# Patient Record
Sex: Male | Born: 1970 | Race: Black or African American | Hispanic: No | Marital: Single | Smoking: Never smoker
Health system: Southern US, Community
[De-identification: ages and names within clinical notes are randomized; demographics above are authoritative.]

## PROBLEM LIST (undated history)

## (undated) DIAGNOSIS — J3089 Other allergic rhinitis: Secondary | ICD-10-CM

## (undated) DIAGNOSIS — J45909 Unspecified asthma, uncomplicated: Secondary | ICD-10-CM

---

## 2004-05-08 ENCOUNTER — Emergency Department: Payer: Self-pay | Admitting: General Practice

## 2017-12-25 ENCOUNTER — Other Ambulatory Visit: Payer: Self-pay

## 2017-12-25 ENCOUNTER — Encounter (HOSPITAL_BASED_OUTPATIENT_CLINIC_OR_DEPARTMENT_OTHER): Payer: Self-pay

## 2017-12-25 ENCOUNTER — Emergency Department (HOSPITAL_BASED_OUTPATIENT_CLINIC_OR_DEPARTMENT_OTHER)
Admission: EM | Admit: 2017-12-25 | Discharge: 2017-12-25 | Disposition: A | Discharge status: Home / Self Care | Payer: BLUE CROSS/BLUE SHIELD | Attending: Emergency Medicine | Admitting: Emergency Medicine

## 2017-12-25 DIAGNOSIS — J069 Acute upper respiratory infection, unspecified: Secondary | ICD-10-CM | POA: Diagnosis not present

## 2017-12-25 DIAGNOSIS — J029 Acute pharyngitis, unspecified: Secondary | ICD-10-CM | POA: Diagnosis present

## 2017-12-25 DIAGNOSIS — J45909 Unspecified asthma, uncomplicated: Secondary | ICD-10-CM | POA: Diagnosis not present

## 2017-12-25 DIAGNOSIS — Z79899 Other long term (current) drug therapy: Secondary | ICD-10-CM | POA: Diagnosis not present

## 2017-12-25 HISTORY — DX: Unspecified asthma, uncomplicated: J45.909

## 2017-12-25 HISTORY — DX: Other allergic rhinitis: J30.89

## 2017-12-25 MED ORDER — GUAIFENESIN-DM 100-10 MG/5ML PO SYRP: 5.0000 mL | ORAL_SOLUTION | Freq: Three times a day (TID) | ORAL | 0 refills | Status: AC | PRN

## 2017-12-25 NOTE — ED Provider Notes (Signed)
[facility] EMERGENCY DEPARTMENT Provider Note   CSN: [id number] Arrival date & time: 12/25/17  1511     History   Chief Complaint Chief Complaint  Patient presents with  . Cough    HPI Anthony Vazquez is a 47 y.o. male.  HPI Patient presents with cough.  Has had for the last 3 days.  States that it started with sore throat and nasal congestion.  Now the cough is worse in the morning.  Has had Zyrtec without relief.  No chest pain.  No fevers.  History of occasional asthma but states he does not feel as if he is wheezing now.  States he was around a baby that has pneumonia. He does not smoke. Past Medical History:  Diagnosis Date  . Asthma   . Environmental and seasonal allergies     There are no active problems to display for this patient.   History reviewed. No pertinent surgical history.      Home Medications    Prior to Admission medications   Medication Sig Start Date End Date Taking? Authorizing Provider  guaiFENesin-dextromethorphan (ROBITUSSIN DM) 100-10 MG/5ML syrup Take 5 mLs by mouth 3 (three) times daily as needed for cough. 12/25/17   Benjiman Core, MD    Family History No family history on file.  Social History Social History   Tobacco Use  . Smoking status: Never Smoker  . Smokeless tobacco: Never Used  Substance Use Topics  . Alcohol use: Never    Frequency: Never  . Drug use: Never     Allergies   Patient has no known allergies.   Review of Systems Review of Systems  Constitutional: Negative for appetite change and fatigue.  HENT: Positive for congestion and sore throat.   Respiratory: Positive for cough. Negative for shortness of breath.   Cardiovascular: Negative for chest pain.  Gastrointestinal: Positive for nausea.  Genitourinary: Negative for flank pain.  Musculoskeletal: Negative for back pain.  Skin: Negative for rash.  Neurological: Negative for speech difficulty.  Hematological: Negative for adenopathy.    Psychiatric/Behavioral: Negative for confusion.     Physical Exam Updated Vital Signs BP 138/83 (BP Location: Left Arm)   Pulse 94   Temp 98.4 F (36.9 C) (Oral)   Resp 18   Ht 5\' 7"  (1.702 m)   Wt 109.3 kg   SpO2 98%   BMI 37.75 kg/m   Physical Exam  Constitutional: He appears well-developed.  HENT:  Head: Atraumatic.  Mild posterior pharyngeal erythema without exudate.  Eyes: Pupils are equal, round, and reactive to light.  Neck: Neck supple.  Cardiovascular: Normal rate.  Pulmonary/Chest: Effort normal. No stridor. He has no wheezes. He has no rales.  Abdominal: There is no tenderness.  Musculoskeletal: He exhibits no edema.  Neurological: He is alert.  Skin: Skin is warm.     ED Treatments / Results  Labs (all labs ordered are listed, but only abnormal results are displayed) Labs Reviewed - No data to display  EKG None  Radiology No results found.  Procedures Procedures (including critical care time)  Medications Ordered in ED Medications - No data to display   Initial Impression / Assessment and Plan / ED Course  I have reviewed the triage vital signs and the nursing notes.  Pertinent labs & imaging results that were available during my care of the patient were reviewed by me and considered in my medical decision making (see chart for details).     Patient with URI symptoms.  Lungs are clear.  Doubt pneumonia.  Doubt strep.  Given prescription for Robitussin-DM to take as needed.  Patient also has some issues and will need to be followed with a primary care doctor, such as requesting Viagra.  Final Clinical Impressions(s) / ED Diagnoses   Final diagnoses:  Upper respiratory tract infection, unspecified type    ED Discharge Orders         Ordered    guaiFENesin-dextromethorphan (ROBITUSSIN DM) 100-10 MG/5ML syrup  3 times daily PRN     12/25/17 1544           Benjiman Core, MD 12/25/17 1601

## 2017-12-25 NOTE — ED Notes (Signed)
Pt verbalizes understanding of d/c instructions and denies any further needs at this time. 

## 2017-12-25 NOTE — Discharge Instructions (Signed)
Follow-up with the primary care doctor for further medical issues.

## 2017-12-25 NOTE — ED Triage Notes (Signed)
C/o flu like sx x 3 days-NAD-steady gait 

## 2017-12-31 ENCOUNTER — Ambulatory Visit: Payer: BLUE CROSS/BLUE SHIELD | Admitting: Family Medicine

## 2018-01-12 ENCOUNTER — Ambulatory Visit: Payer: BLUE CROSS/BLUE SHIELD | Admitting: Family Medicine

## 2018-01-12 DIAGNOSIS — Z0289 Encounter for other administrative examinations: Secondary | ICD-10-CM

## 2018-05-02 ENCOUNTER — Emergency Department (HOSPITAL_BASED_OUTPATIENT_CLINIC_OR_DEPARTMENT_OTHER)
Admission: EM | Admit: 2018-05-02 | Discharge: 2018-05-02 | Disposition: A | Discharge status: Home / Self Care | Payer: BLUE CROSS/BLUE SHIELD | Attending: Emergency Medicine | Admitting: Emergency Medicine

## 2018-05-02 ENCOUNTER — Other Ambulatory Visit: Payer: Self-pay

## 2018-05-02 ENCOUNTER — Encounter (HOSPITAL_BASED_OUTPATIENT_CLINIC_OR_DEPARTMENT_OTHER): Payer: Self-pay | Admitting: *Deleted

## 2018-05-02 DIAGNOSIS — B353 Tinea pedis: Secondary | ICD-10-CM | POA: Insufficient documentation

## 2018-05-02 DIAGNOSIS — R519 Headache, unspecified: Secondary | ICD-10-CM

## 2018-05-02 DIAGNOSIS — J45909 Unspecified asthma, uncomplicated: Secondary | ICD-10-CM | POA: Insufficient documentation

## 2018-05-02 DIAGNOSIS — J309 Allergic rhinitis, unspecified: Secondary | ICD-10-CM | POA: Insufficient documentation

## 2018-05-02 DIAGNOSIS — R51 Headache: Secondary | ICD-10-CM | POA: Insufficient documentation

## 2018-05-02 DIAGNOSIS — Z79899 Other long term (current) drug therapy: Secondary | ICD-10-CM | POA: Insufficient documentation

## 2018-05-02 MED ORDER — CETIRIZINE HCL 10 MG PO TABS: 10.0000 mg | ORAL_TABLET | Freq: Every day | ORAL | 0 refills | Status: AC

## 2018-05-02 MED ORDER — CLOTRIMAZOLE 1 % EX CREA: TOPICAL_CREAM | CUTANEOUS | 0 refills | Status: AC

## 2018-05-02 MED ORDER — FLUTICASONE PROPIONATE 50 MCG/ACT NA SUSP: 1.0000 | Freq: Every day | NASAL | 0 refills | Status: AC

## 2018-05-02 MED ORDER — TETRACAINE HCL 0.5 % OP SOLN
1.0000 [drp] | Freq: Once | OPHTHALMIC | Status: DC
Start: 2018-05-02 — End: 2018-05-02
  Filled 2018-05-02: qty 4

## 2018-05-02 NOTE — ED Triage Notes (Signed)
Pt c/o pain behind right eye since yesterday

## 2018-05-02 NOTE — ED Provider Notes (Signed)
[facility] EMERGENCY DEPARTMENT Provider Note   CSN: [id number] Arrival date & time: 05/02/18  2106    History   Chief Complaint Chief Complaint  Patient presents with  . Eye Pain    HPI Anthony Vazquez is a 48 y.o. male.     48yo M w/ PMH including asthma and allergies who p/w headache and sinus pain.  Patient states that over the last day he has had nasal congestion, right-sided rhinorrhea, and sinus pain that goes up the right side of his face to his scalp.  He does report history of seasonal allergies and this feels similar.  No associated cough, vision changes, fevers, vomiting, extremity weakness/numbness, balance problems.  She also notes problem with athlete's foot on both feet.  He has previously used clotrimazole but does not currently have the medication.  The history is provided by the patient.  Eye Pain     Past Medical History:  Diagnosis Date  . Asthma   . Environmental and seasonal allergies     There are no active problems to display for this patient.   History reviewed. No pertinent surgical history.      Home Medications    Prior to Admission medications   Medication Sig Start Date End Date Taking? Authorizing Provider  etodolac (LODINE) 400 MG tablet Take by mouth. 04/23/18  Yes [provider]  cetirizine (ZYRTEC ALLERGY) 10 MG tablet Take 1 tablet (10 mg total) by mouth daily. 05/02/18   Irelynn Schermerhorn, Ambrose Finland, MD  clotrimazole (LOTRIMIN) 1 % cream Apply to affected area 2 times daily until symptoms resolve 05/02/18   Patrici Minnis, Ambrose Finland, MD  fluticasone [facility]) 50 MCG/ACT nasal spray Place 1 spray into both nostrils daily. 05/02/18   Rosalene Wardrop, Ambrose Finland, MD  gabapentin (NEURONTIN) 300 MG capsule  04/01/18   [provider]  guaiFENesin-dextromethorphan (ROBITUSSIN DM) 100-10 MG/5ML syrup Take 5 mLs by mouth 3 (three) times daily as needed for cough. 12/25/17   Benjiman Core, MD  loperamide (IMODIUM) 2 MG capsule TK 1 C  PO QID PRN FOR DIARRHEA 04/20/18   [provider]  meloxicam (MOBIC) 15 MG tablet  04/01/18   [provider]    Family History No family history on file.  Social History Social History   Tobacco Use  . Smoking status: Never Smoker  . Smokeless tobacco: Never Used  Substance Use Topics  . Alcohol use: Never    Frequency: Never  . Drug use: Never     Allergies   Patient has no known allergies.   Review of Systems Review of Systems  Eyes: Positive for pain.   All other systems reviewed and are negative except that which was mentioned in HPI   Physical Exam Updated Vital Signs BP 132/81 (BP Location: Left Arm)   Pulse 93   Temp 98 F (36.7 C) (Oral)   Resp 18   Ht 5\' 7"  (1.702 m)   Wt 97.5 kg   SpO2 96%   BMI 33.67 kg/m   Physical Exam Vitals signs and nursing note reviewed.  Constitutional:      General: He is not in acute distress.    Appearance: He is well-developed.     Comments: Awake, alert  HENT:     Head: Normocephalic and atraumatic.     Nose: Congestion and rhinorrhea present.     Mouth/Throat:     Mouth: Mucous membranes are moist.     Pharynx: Oropharynx is clear.  Eyes:  Extraocular Movements: Extraocular movements intact.     Conjunctiva/sclera: Conjunctivae normal.     Pupils: Pupils are equal, round, and reactive to light.  Neck:     Musculoskeletal: Neck supple.  Cardiovascular:     Rate and Rhythm: Normal rate and regular rhythm.     Heart sounds: Normal heart sounds. No murmur.  Pulmonary:     Effort: Pulmonary effort is normal. No respiratory distress.     Breath sounds: Normal breath sounds.  Abdominal:     General: Bowel sounds are normal. There is no distension.     Palpations: Abdomen is soft.     Tenderness: There is no abdominal tenderness.  Skin:    General: Skin is warm and dry.     Comments: Erythema and mild skin breakdown with malodor between toes  Neurological:     Mental Status: He is alert  and oriented to person, place, and time.     Cranial Nerves: No cranial nerve deficit.     Motor: No abnormal muscle tone.     Deep Tendon Reflexes: Reflexes are normal and symmetric.     Comments: Fluent speech 5/5 strength and normal sensation x all 4 extremities  Psychiatric:        Thought Content: Thought content normal.        Judgment: Judgment normal.      ED Treatments / Results  Labs (all labs ordered are listed, but only abnormal results are displayed) Labs Reviewed - No data to display  EKG None  Radiology No results found.  Procedures Procedures (including critical care time)  Medications Ordered in ED Medications - No data to display   Initial Impression / Assessment and Plan / ED Course  I have reviewed the triage vital signs and the nursing notes.         Symptoms consistent with allergic rhinitis with sinus pressure/pain.  No concerning neurologic features.  No actual eye symptoms to suggest ophthalmologic emergency.  Recommended starting Zyrtec and Flonase.  He also has athlete's foot, provided with Lotrimin.  Final Clinical Impressions(s) / ED Diagnoses   Final diagnoses:  Allergic rhinitis, unspecified seasonality, unspecified trigger  Sinus headache  Tinea pedis of both feet    ED Discharge Orders         Ordered    cetirizine (ZYRTEC ALLERGY) 10 MG tablet  Daily     05/02/18 2142    fluticasone (FLONASE) 50 MCG/ACT nasal spray  Daily     05/02/18 2142    clotrimazole (LOTRIMIN) 1 % cream     05/02/18 2142           Amauria Younts, Ambrose Finland, MD 05/02/18 [phone]

## 2018-06-24 ENCOUNTER — Emergency Department (HOSPITAL_BASED_OUTPATIENT_CLINIC_OR_DEPARTMENT_OTHER): Payer: BLUE CROSS/BLUE SHIELD

## 2018-06-24 ENCOUNTER — Encounter (HOSPITAL_BASED_OUTPATIENT_CLINIC_OR_DEPARTMENT_OTHER): Payer: Self-pay

## 2018-06-24 ENCOUNTER — Emergency Department (HOSPITAL_BASED_OUTPATIENT_CLINIC_OR_DEPARTMENT_OTHER)
Admission: EM | Admit: 2018-06-24 | Discharge: 2018-06-24 | Disposition: A | Discharge status: Home / Self Care | Payer: BLUE CROSS/BLUE SHIELD | Attending: Emergency Medicine | Admitting: Emergency Medicine

## 2018-06-24 ENCOUNTER — Other Ambulatory Visit: Payer: Self-pay

## 2018-06-24 DIAGNOSIS — W228XXA Striking against or struck by other objects, initial encounter: Secondary | ICD-10-CM | POA: Diagnosis not present

## 2018-06-24 DIAGNOSIS — Y990 Civilian activity done for income or pay: Secondary | ICD-10-CM | POA: Insufficient documentation

## 2018-06-24 DIAGNOSIS — Z79899 Other long term (current) drug therapy: Secondary | ICD-10-CM | POA: Diagnosis not present

## 2018-06-24 DIAGNOSIS — M25512 Pain in left shoulder: Secondary | ICD-10-CM

## 2018-06-24 DIAGNOSIS — Y9389 Activity, other specified: Secondary | ICD-10-CM | POA: Diagnosis not present

## 2018-06-24 DIAGNOSIS — S40012A Contusion of left shoulder, initial encounter: Secondary | ICD-10-CM | POA: Diagnosis not present

## 2018-06-24 DIAGNOSIS — Y9289 Other specified places as the place of occurrence of the external cause: Secondary | ICD-10-CM | POA: Diagnosis not present

## 2018-06-24 DIAGNOSIS — J45909 Unspecified asthma, uncomplicated: Secondary | ICD-10-CM | POA: Diagnosis not present

## 2018-06-24 DIAGNOSIS — S4992XA Unspecified injury of left shoulder and upper arm, initial encounter: Secondary | ICD-10-CM | POA: Diagnosis present

## 2018-06-24 NOTE — ED Provider Notes (Signed)
[facility] EMERGENCY DEPARTMENT Provider Note   CSN: [id number] Arrival date & time: 06/24/18  1743    History   Chief Complaint Chief Complaint  Patient presents with  . Shoulder Pain    HPI Anthony Vazquez is a 48 y.o. male.     Patient is a 48 year old gentleman with past medical history of asthma who presents the emergency department for left shoulder pain.  Patient reports that 2 days ago while he was at work and lifting a heavy door with someone he door came back and hit him right in the anterior left shoulder.  Reports pain since then.  Denies any numbness, tingling.  Reports the pain sometimes radiates into his neck.  Describes it as soreness.  He is still able to do lifting and range of motion with his shoulder but would like to have it checked out to make sure it is nothing worse.     Past Medical History:  Diagnosis Date  . Asthma   . Environmental and seasonal allergies     There are no active problems to display for this patient.   History reviewed. No pertinent surgical history.      Home Medications    Prior to Admission medications   Medication Sig Start Date End Date Taking? Authorizing Provider  cetirizine (ZYRTEC ALLERGY) 10 MG tablet Take 1 tablet (10 mg total) by mouth daily. 05/02/18   Little, Ambrose Finlandachel Morgan, MD  clotrimazole (LOTRIMIN) 1 % cream Apply to affected area 2 times daily until symptoms resolve 05/02/18   Little, Ambrose Finlandachel Morgan, MD  etodolac (LODINE) 400 MG tablet Take by mouth. 04/23/18   [provider]  fluticasone (FLONASE) 50 MCG/ACT nasal spray Place 1 spray into both nostrils daily. 05/02/18   Little, Ambrose Finlandachel Morgan, MD  gabapentin (NEURONTIN) 300 MG capsule  04/01/18   [provider]  guaiFENesin-dextromethorphan (ROBITUSSIN DM) 100-10 MG/5ML syrup Take 5 mLs by mouth 3 (three) times daily as needed for cough. 12/25/17   Benjiman CorePickering, Nathan, MD  loperamide (IMODIUM) 2 MG capsule TK 1 C PO QID PRN FOR DIARRHEA 04/20/18    [provider]  meloxicam (MOBIC) 15 MG tablet  04/01/18   [provider]    Family History No family history on file.  Social History Social History   Tobacco Use  . Smoking status: Never Smoker  . Smokeless tobacco: Never Used  Substance Use Topics  . Alcohol use: Never    Frequency: Never  . Drug use: Never     Allergies   Patient has no known allergies.   Review of Systems Review of Systems  Constitutional: Negative for fever.  Musculoskeletal: Positive for arthralgias and neck pain. Negative for back pain, joint swelling and neck stiffness.  Skin: Negative for rash and wound.  Hematological: Does not bruise/bleed easily.     Physical Exam Updated Vital Signs BP 122/82 (BP Location: Left Arm)   Pulse 83   Temp 98 F (36.7 C) (Oral)   Resp 18   Ht 5\' 7"  (1.702 m)   Wt 97.5 kg   SpO2 97%   BMI 33.67 kg/m   Physical Exam Vitals signs and nursing note reviewed.  Constitutional:      Appearance: Normal appearance.  HENT:     Head: Normocephalic.  Eyes:     Conjunctiva/sclera: Conjunctivae normal.  Pulmonary:     Effort: Pulmonary effort is normal.  Musculoskeletal:     Left shoulder: He exhibits tenderness. He exhibits normal range of  motion, no swelling, no effusion, no deformity and no laceration.     Comments: Normal strength, sensation and range of motion of the left shoulder but he does have point tenderness at the [facility] joint.  Skin:    General: Skin is dry.  Neurological:     Mental Status: He is alert.  Psychiatric:        Mood and Affect: Mood normal.      ED Treatments / Results  Labs (all labs ordered are listed, but only abnormal results are displayed) Labs Reviewed - No data to display  EKG None  Radiology Dg Shoulder Left  Result Date: 06/24/2018 CLINICAL DATA:  Pain and injury EXAM: LEFT SHOULDER - 2+ VIEW COMPARISON:  None. FINDINGS: There is no evidence of fracture or dislocation. There is no evidence of  arthropathy or other focal bone abnormality. Soft tissues are unremarkable. IMPRESSION: Negative. Electronically Signed   By: Jasmine Pang M.D.   On: 06/24/2018 18:57    Procedures Procedures (including critical care time)  Medications Ordered in ED Medications - No data to display   Initial Impression / Assessment and Plan / ED Course  I have reviewed the triage vital signs and the nursing notes.  Pertinent labs & imaging results that were available during my care of the patient were reviewed by me and considered in my medical decision making (see chart for details).        Based on review of vitals, medical screening exam, lab work and/or imaging, there does not appear to be an acute, emergent etiology for the patient's symptoms. Counseled pt on good return precautions and encouraged both PCP and ED follow-up as needed.  Prior to discharge, I also discussed incidental imaging findings with patient in detail and advised appropriate, recommended follow-up in detail.  Clinical Impression: 1. Contusion of left shoulder, initial encounter   2. Acute pain of left shoulder     Disposition: Discharge  Prior to providing a prescription for a controlled substance, I independently reviewed the patient's recent prescription history on the [place] Controlled Substance Reporting System. The patient had no recent or regular prescriptions and was deemed appropriate for a brief, less than 3 day prescription of narcotic for acute analgesia.  This note was prepared with assistance of Conservation officer, historic buildings. Occasional wrong-word or sound-a-like substitutions may have occurred due to the inherent limitations of voice recognition software.   Final Clinical Impressions(s) / ED Diagnoses   Final diagnoses:  Contusion of left shoulder, initial encounter  Acute pain of left shoulder    ED Discharge Orders    None       Jeral Pinch 06/24/18 1917    Melene Plan,  DO 06/24/18 2142

## 2018-06-24 NOTE — Discharge Instructions (Signed)
Your shoulder x-ray is normal.  I think you just strained your shoulder.  You should rest your left shoulder, apply ice often for at least 1 week.

## 2018-06-24 NOTE — ED Triage Notes (Signed)
Pt states he was lifting a door at work 2 days ago-felt a pop in left shoulder-NAD-steady gait

## 2018-06-30 ENCOUNTER — Emergency Department (HOSPITAL_BASED_OUTPATIENT_CLINIC_OR_DEPARTMENT_OTHER)
Admission: EM | Admit: 2018-06-30 | Discharge: 2018-06-30 | Disposition: A | Discharge status: Home / Self Care | Payer: BLUE CROSS/BLUE SHIELD | Attending: Emergency Medicine | Admitting: Emergency Medicine

## 2018-06-30 ENCOUNTER — Encounter (HOSPITAL_BASED_OUTPATIENT_CLINIC_OR_DEPARTMENT_OTHER): Payer: Self-pay | Admitting: Emergency Medicine

## 2018-06-30 ENCOUNTER — Other Ambulatory Visit: Payer: Self-pay

## 2018-06-30 DIAGNOSIS — H11432 Conjunctival hyperemia, left eye: Secondary | ICD-10-CM | POA: Diagnosis present

## 2018-06-30 DIAGNOSIS — H1032 Unspecified acute conjunctivitis, left eye: Secondary | ICD-10-CM | POA: Diagnosis not present

## 2018-06-30 DIAGNOSIS — J45909 Unspecified asthma, uncomplicated: Secondary | ICD-10-CM | POA: Diagnosis not present

## 2018-06-30 MED ORDER — ERYTHROMYCIN 5 MG/GM OP OINT: TOPICAL_OINTMENT | OPHTHALMIC | 0 refills | Status: AC

## 2018-06-30 MED FILL — ERYTHROMYCIN EYE OINTMENT: 5 | 7 days supply | Qty: 4 | Fill #0

## 2018-06-30 NOTE — ED Triage Notes (Signed)
Pt c/o LT eye irritation x 3d

## 2018-06-30 NOTE — ED Provider Notes (Signed)
   Emergency Department Provider Note   I have reviewed the triage vital signs and the nursing notes.   HISTORY  Chief Complaint Eye Problem   HPI Anthony Vazquez is a 48 y.o. male with PMH of seasonal allergies and asthma presents to the emergency department for evaluation of left eye redness with mild irritation.  Denies pain or vision change.  Symptoms began yesterday.  He has family members with similar symptoms.  Denies fevers, pain behind the eye, or injury to the eye.  He does not wear contact lenses.  No radiation of symptoms or other modifying factors. Patient did note some thick discharge on the eyelid this AM.   Past Medical History:  Diagnosis Date  . Asthma   . Environmental and seasonal allergies     There are no active problems to display for this patient.   History reviewed. No pertinent surgical history.  Allergies Patient has no known allergies.  No family history on file.  Social History Social History   Tobacco Use  . Smoking status: Never Smoker  . Smokeless tobacco: Never Used  Substance Use Topics  . Alcohol use: Never    Frequency: Never  . Drug use: Never    Review of Systems  Constitutional: No fever/chills Eyes: No visual changes. Positive left eye redness.  ENT: No sore throat.  10-point ROS otherwise negative.  ____________________________________________   PHYSICAL EXAM:  VITAL SIGNS: ED Triage Vitals  Enc Vitals Group     BP 06/30/18 0955 110/78     Pulse Rate 06/30/18 0955 80     Resp 06/30/18 0955 20     Temp 06/30/18 0955 98 F (36.7 C)     Temp Source 06/30/18 0955 Oral     SpO2 06/30/18 0955 96 %     Weight 06/30/18 0956 224 lb 3 oz (101.7 kg)     Height 06/30/18 0956 5' 7.5" (1.715 m)    Constitutional: Alert and oriented. Well appearing and in no acute distress. Eyes: Conjunctivae are injected on the left. No periorbital cellulitis or lid edema. PERRL. EOMI. Head: Atraumatic. Nose: No congestion/rhinnorhea.  Mouth/Throat: Mucous membranes are moist.  Neck: No stridor.   Cardiovascular: Normal rate, regular rhythm. Respiratory: Normal respiratory effort.  Gastrointestinal:  No distention.  Musculoskeletal: No gross deformities of extremities. Neurologic:  Normal speech and language.  Skin: No rash noted.  ____________________________________________   PROCEDURES  Procedure(s) performed:   Procedures  None  ____________________________________________   INITIAL IMPRESSION / ASSESSMENT AND PLAN / ED COURSE  Pertinent labs & imaging results that were available during my care of the patient were reviewed by me and considered in my medical decision making (see chart for details).   Patient presents to the emergency department with left eye irritation and redness.  Symptoms are consistent with conjunctivitis.  Plan for erythromycin ointment.  Patient does not wear contact lenses.  His exam is not consistent with periorbital or orbital cellulitis.  No vision changes or pain to suspect acute glaucoma.  Plan for antibiotic ointment and PCP follow-up.  Discussed ED return precautions.   ____________________________________________  FINAL CLINICAL IMPRESSION(S) / ED DIAGNOSES  Final diagnoses:  Acute conjunctivitis of left eye, unspecified acute conjunctivitis type    Note:  This document was prepared using Dragon voice recognition software and may include unintentional dictation errors.  Alona Bene, MD Emergency Medicine    , Arlyss Repress, MD 06/30/18 1026

## 2018-06-30 NOTE — Discharge Instructions (Signed)
You have an eye infection called conjunctivitis.  This can be caused by a viral or bacterial infection, but we treat with antibiotics either way to be safe.  Please use the provided antibiotics or fill the provided prescription and use as directed.  Follow up as indicated on your instructions.  Return to the Emergency Department if your symptoms worsen in spite of treatment or if you develop new symptoms that concern you. ° °

## 2019-07-16 IMAGING — DX LEFT SHOULDER - 2+ VIEW
3 series · 3 of 3 positions shown · non-contrast
Comparison: None.

CLINICAL DATA: Pain and injury

EXAM:
LEFT SHOULDER - 2+ VIEW

[shoulder grashey]
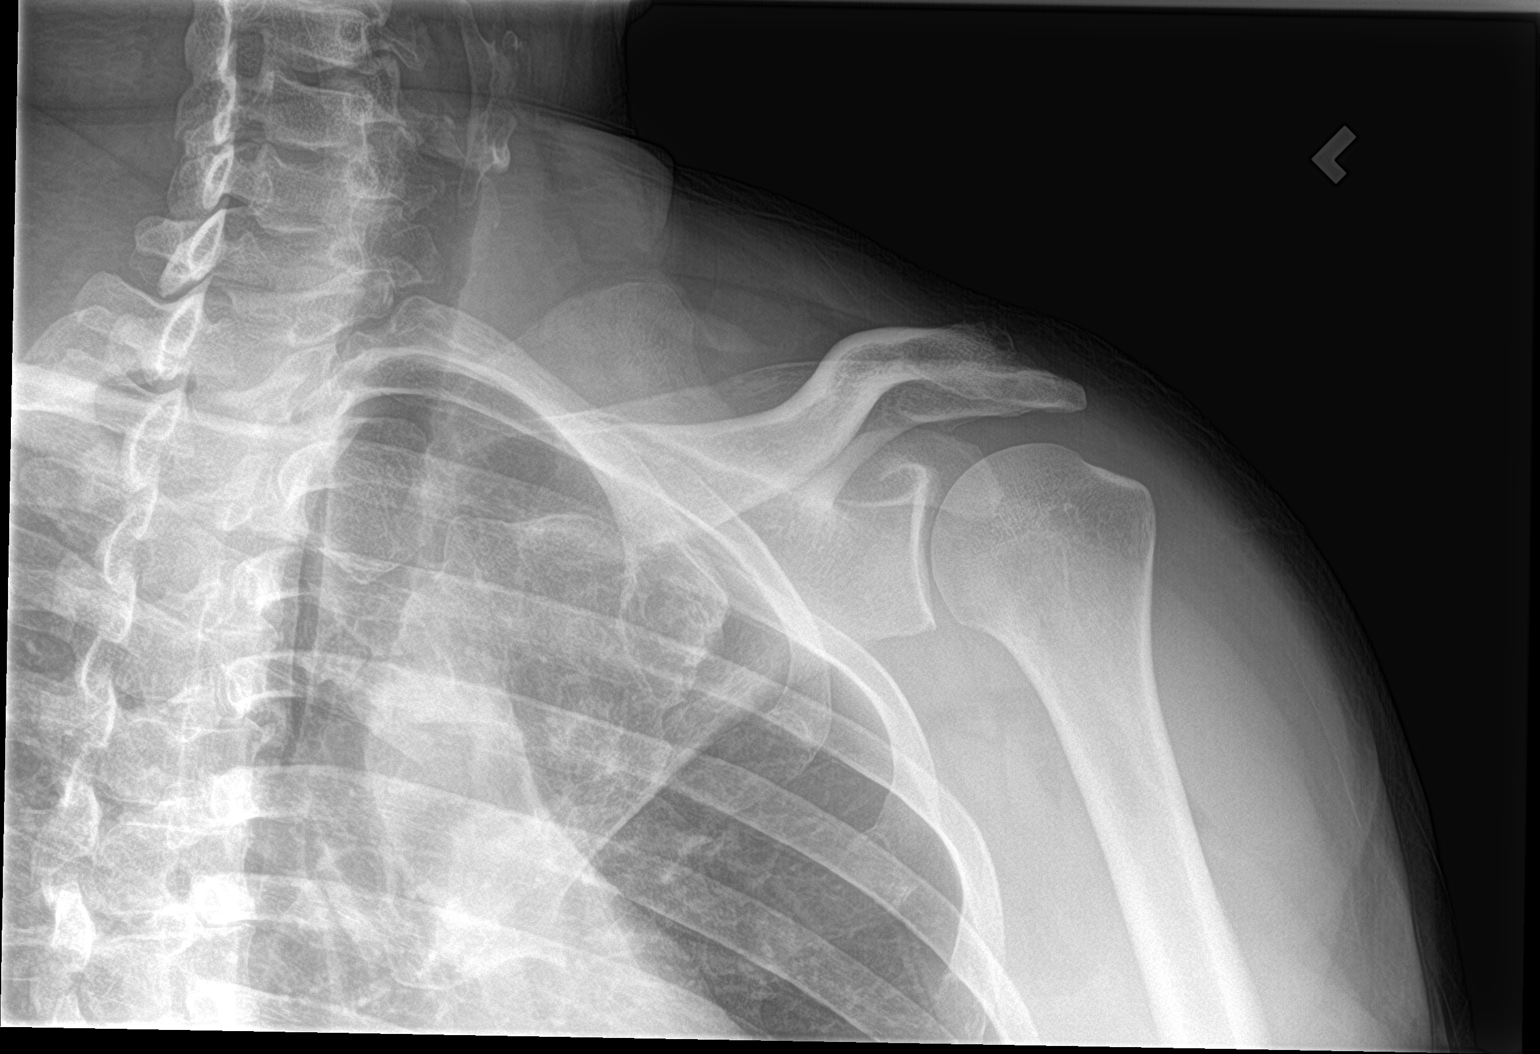

[shoulder axillary]
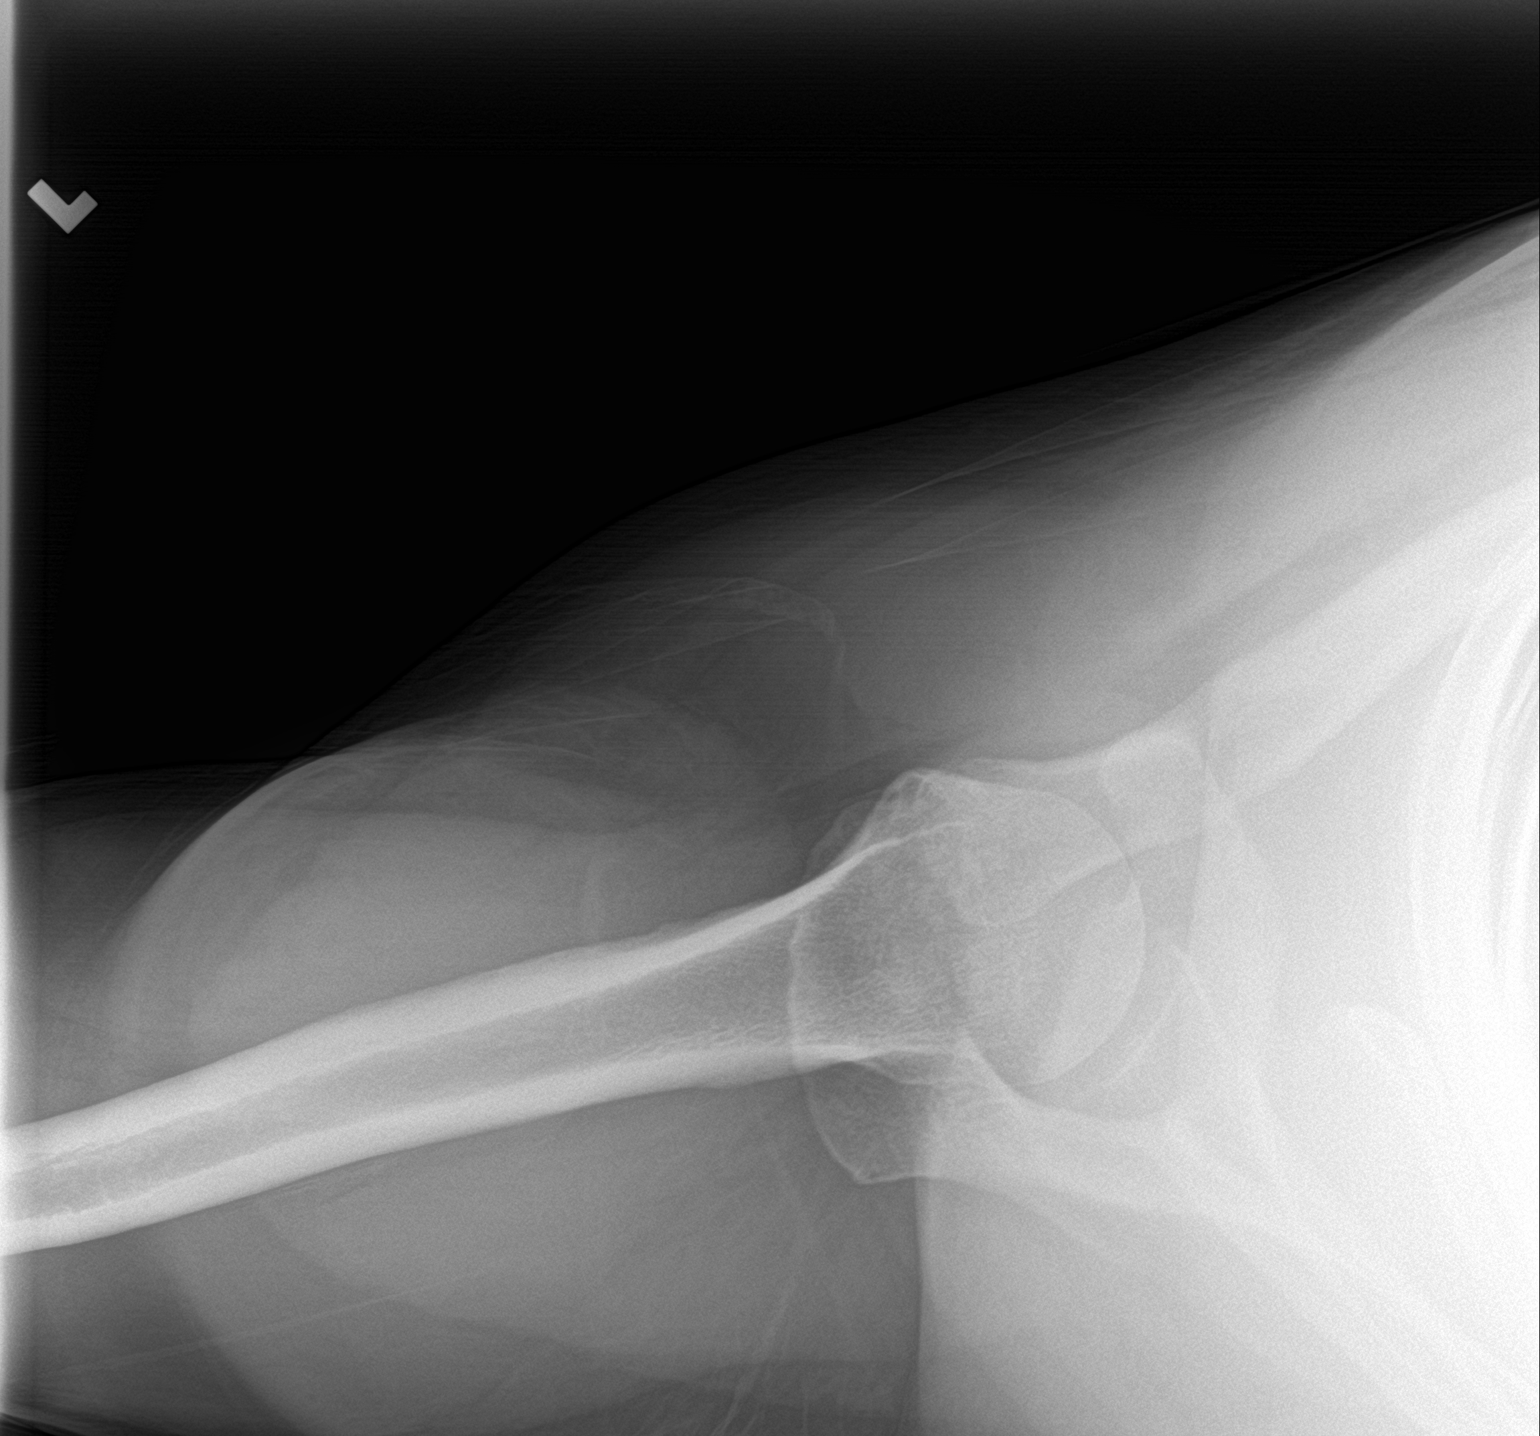

[shoulder y view]
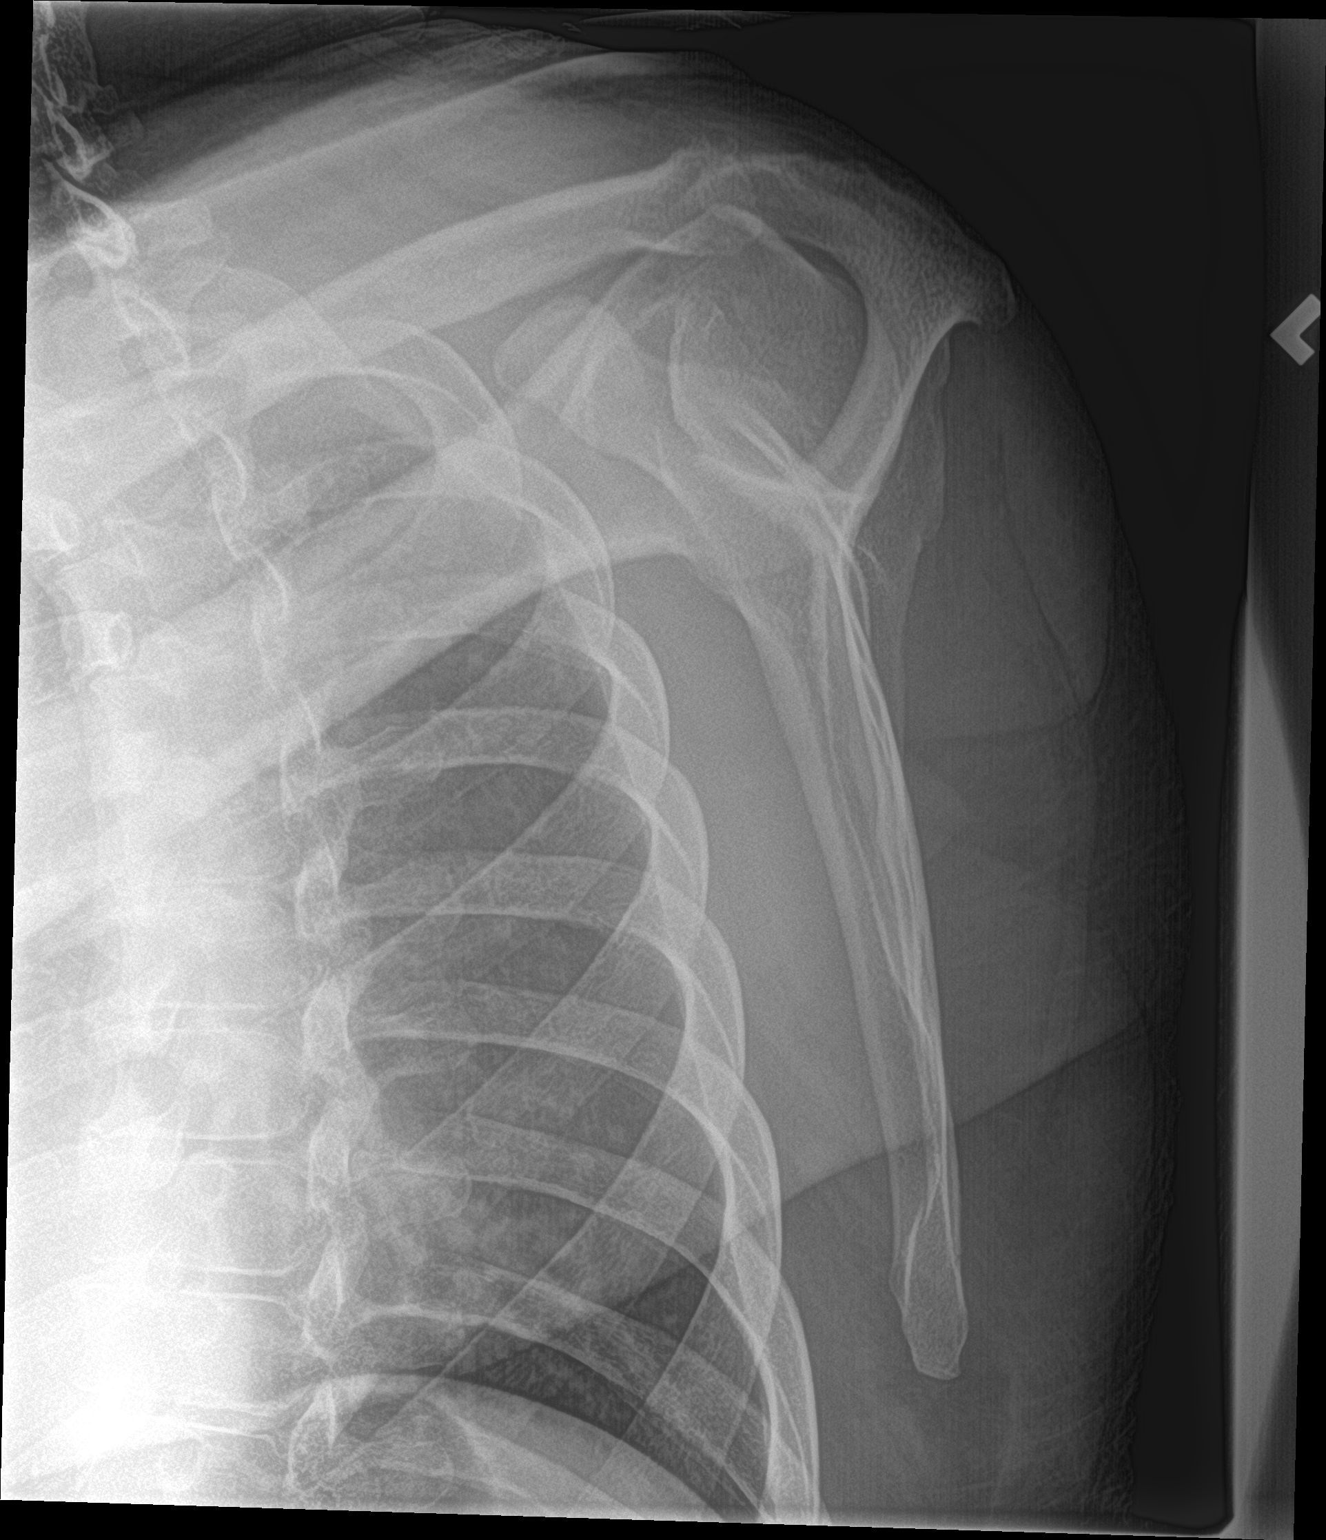

[3 of 3 positions shown; findings below may reference images not displayed]

FINDINGS: There is no evidence of fracture or dislocation. There is no
evidence of arthropathy or other focal bone abnormality. Soft
tissues are unremarkable.
IMPRESSION: Negative.
